# Patient Record
Sex: Female | Born: 1980 | Race: White | Hispanic: No | Marital: Single | Smoking: Current every day smoker
Health system: Southern US, Community
[De-identification: ages and names within clinical notes are randomized; demographics above are authoritative.]

---
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
## 1998-08-10 ENCOUNTER — Emergency Department (HOSPITAL_COMMUNITY): Admission: EM | Admit: 1998-08-10 | Discharge: 1998-08-10 | Payer: Self-pay | Admitting: Emergency Medicine

## 2001-12-30 ENCOUNTER — Ambulatory Visit (HOSPITAL_COMMUNITY): Admission: RE | Admit: 2001-12-30 | Discharge: 2001-12-30 | Payer: Self-pay | Admitting: *Deleted

## 2003-04-07 ENCOUNTER — Other Ambulatory Visit: Admission: RE | Admit: 2003-04-07 | Discharge: 2003-04-07 | Payer: Self-pay | Admitting: Obstetrics & Gynecology

## 2003-04-07 ENCOUNTER — Inpatient Hospital Stay (HOSPITAL_COMMUNITY): Admission: AD | Admit: 2003-04-07 | Discharge: 2003-04-07 | Payer: Self-pay | Admitting: Obstetrics & Gynecology

## 2003-04-07 ENCOUNTER — Encounter: Payer: Self-pay | Admitting: Obstetrics & Gynecology

## 2003-04-07 ENCOUNTER — Encounter (INDEPENDENT_AMBULATORY_CARE_PROVIDER_SITE_OTHER): Payer: Self-pay | Admitting: Specialist

## 2003-04-28 ENCOUNTER — Encounter: Admission: RE | Admit: 2003-04-28 | Discharge: 2003-04-28 | Payer: Self-pay | Admitting: Family Medicine

## 2003-04-28 ENCOUNTER — Inpatient Hospital Stay (HOSPITAL_COMMUNITY): Admission: AD | Admit: 2003-04-28 | Discharge: 2003-04-30 | Payer: Self-pay | Admitting: Family Medicine

## 2003-04-29 ENCOUNTER — Encounter (INDEPENDENT_AMBULATORY_CARE_PROVIDER_SITE_OTHER): Payer: Self-pay

## 2003-12-10 ENCOUNTER — Encounter (INDEPENDENT_AMBULATORY_CARE_PROVIDER_SITE_OTHER): Payer: Self-pay | Admitting: Specialist

## 2003-12-10 ENCOUNTER — Ambulatory Visit (HOSPITAL_COMMUNITY): Admission: AD | Admit: 2003-12-10 | Discharge: 2003-12-10 | Payer: Self-pay | Admitting: Obstetrics and Gynecology

## 2004-01-02 ENCOUNTER — Encounter: Admission: RE | Admit: 2004-01-02 | Discharge: 2004-01-02 | Payer: Self-pay | Admitting: Obstetrics & Gynecology

## 2004-05-23 ENCOUNTER — Emergency Department (HOSPITAL_COMMUNITY): Admission: EM | Admit: 2004-05-23 | Discharge: 2004-05-23 | Payer: Self-pay | Admitting: Emergency Medicine

## 2004-10-07 ENCOUNTER — Inpatient Hospital Stay (HOSPITAL_COMMUNITY): Admission: AD | Admit: 2004-10-07 | Discharge: 2004-10-09 | Payer: Self-pay | Admitting: Family Medicine

## 2004-10-07 ENCOUNTER — Encounter (INDEPENDENT_AMBULATORY_CARE_PROVIDER_SITE_OTHER): Payer: Self-pay | Admitting: Specialist

## 2004-10-08 ENCOUNTER — Ambulatory Visit: Payer: Self-pay | Admitting: Obstetrics & Gynecology

## 2005-10-05 IMAGING — US US OB LIMITED
1 series · 14 of 20 positions shown · non-contrast
Comparison: none

CLINICAL DATA: 38+ weeks gestation, for presentation.  No prenatal care.
 OBSTETRICAL ULTRASOUND LIMITED:
 A single fetus is present in cephalic position.  Fetal movement is detected.  The placenta is posteriorly located to the right and grade III.  No previa is seen.  The quantity of amniotic fluid is normal.  AFI is 16.4 cm.  For a 38-week gestation, 7.3 is in the 5th percentile and 23.9 in the 95th percentile.  
 Evaluation of fetal anatomy shows the lateral ventricles, thalami, stomach on left, stomach on left, both kidneys, and bladder to appear normal.  Much of the remainder of the study is limited due to advanced gestational age and request for presentation only.

[Series 1: us ob limited · 0.33mm/px · 14 of 20 slices shown]
[im 1/20]
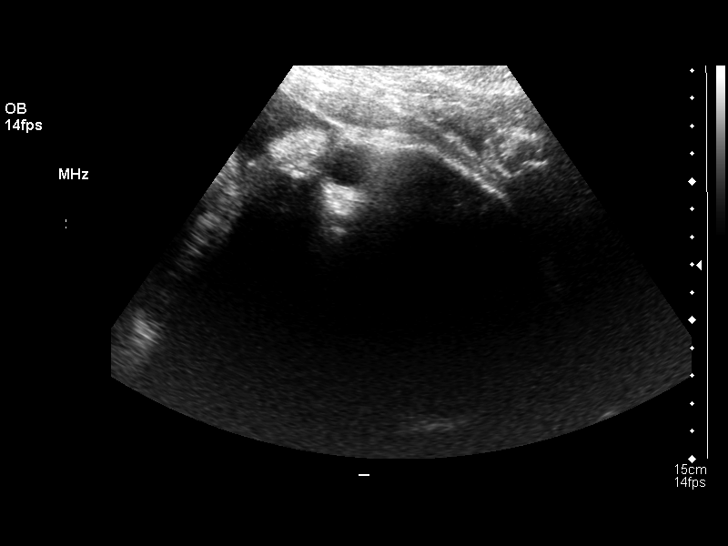
[im 3/20]
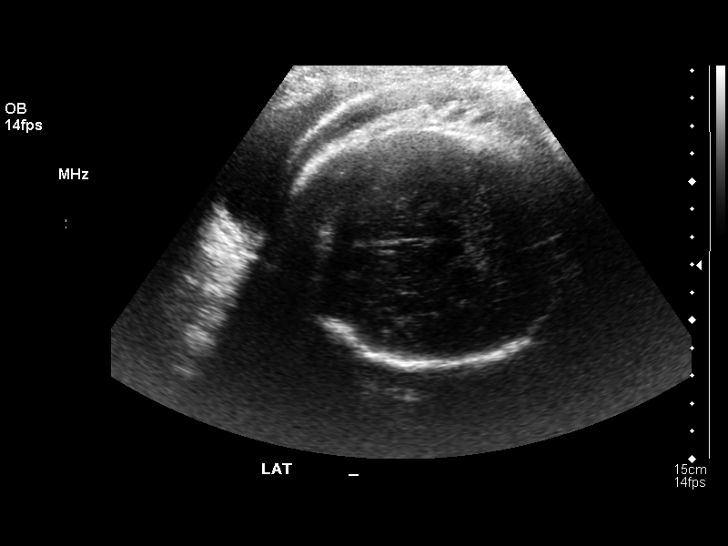
[im 4/20]
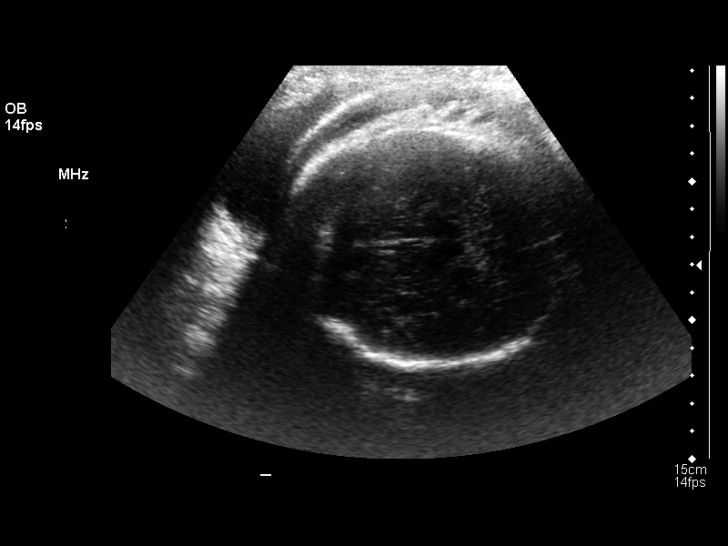
[im 6/20]
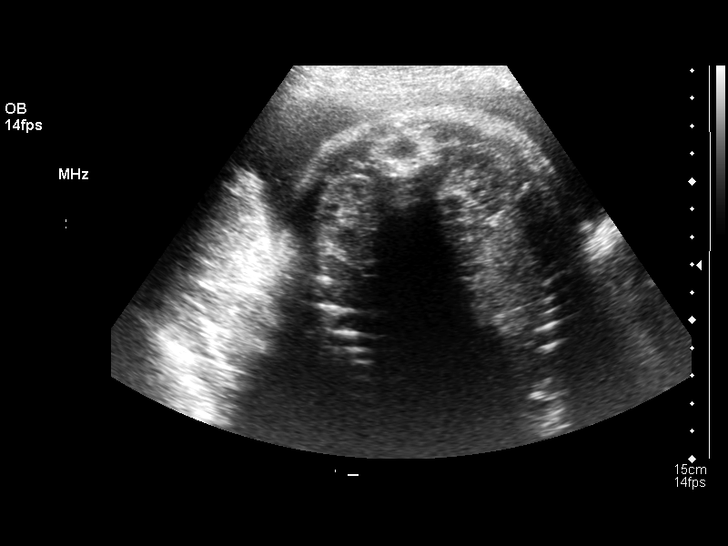
[im 7/20]
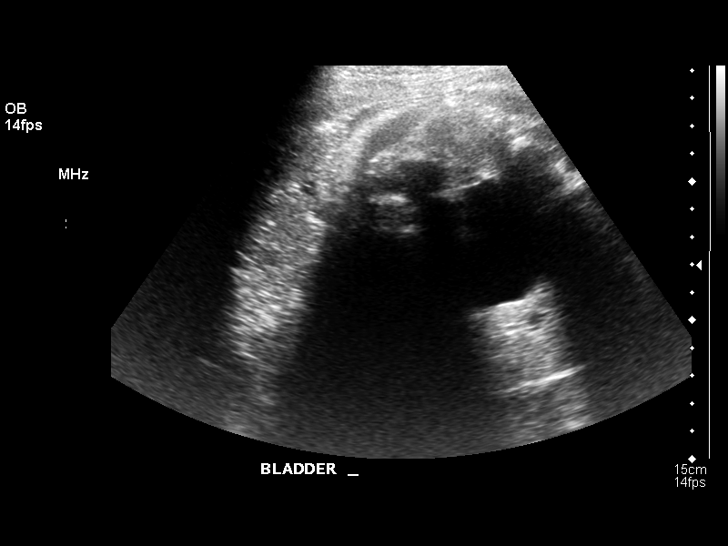
[im 8/20]
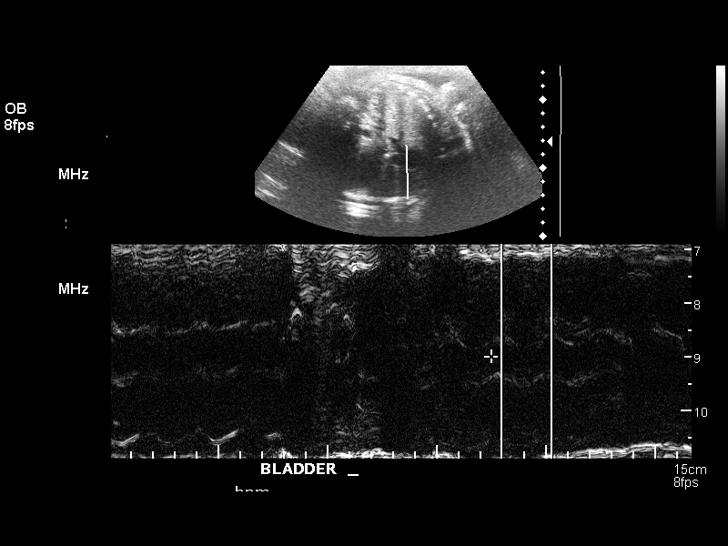
[im 10/20]
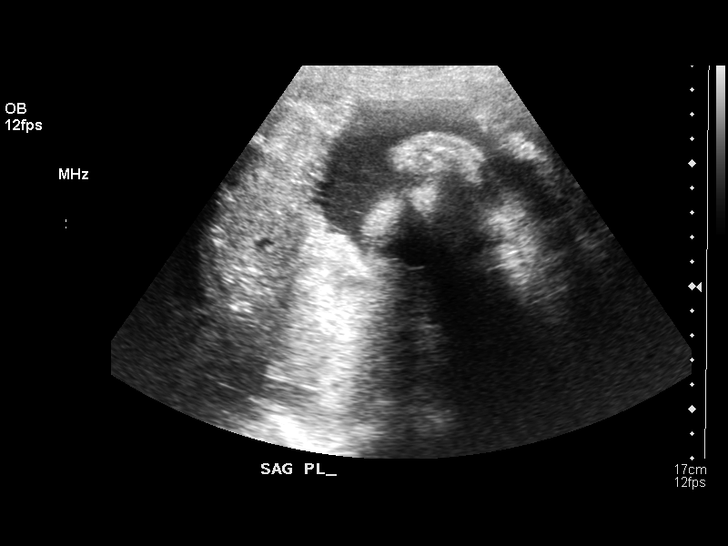
[im 11/20]
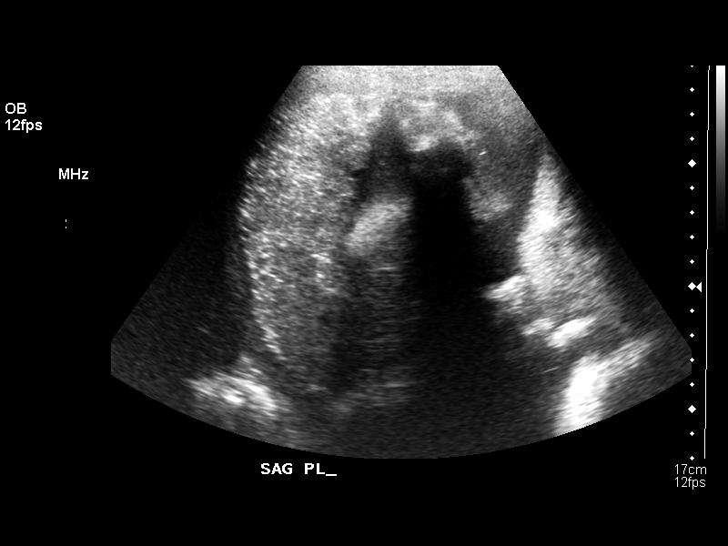
[im 13/20]
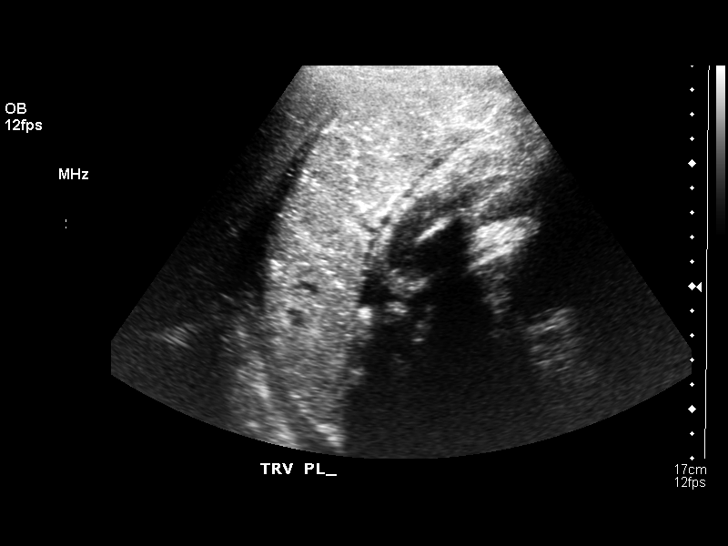
[im 14/20]
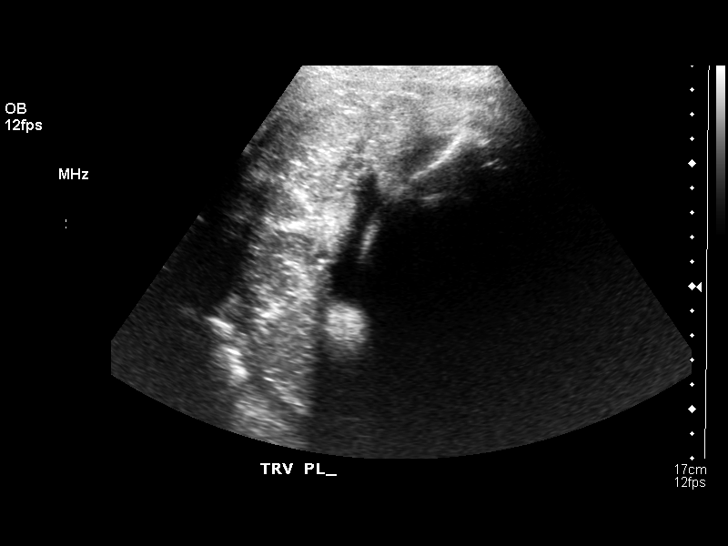
[im 16/20]
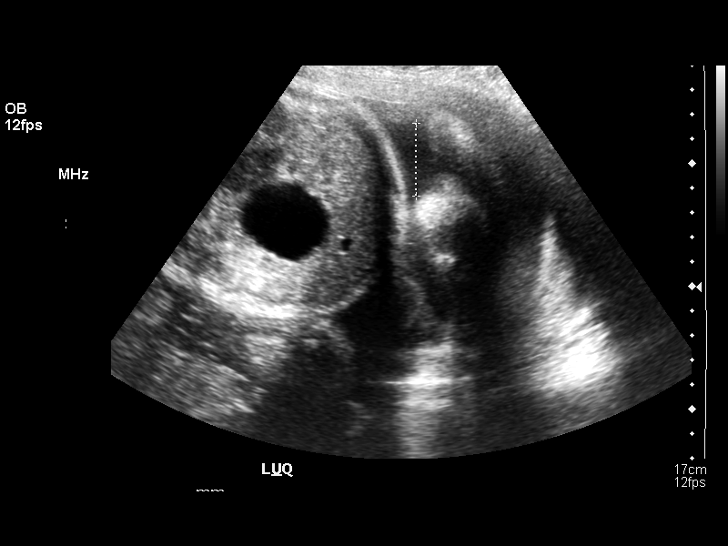
[im 17/20]
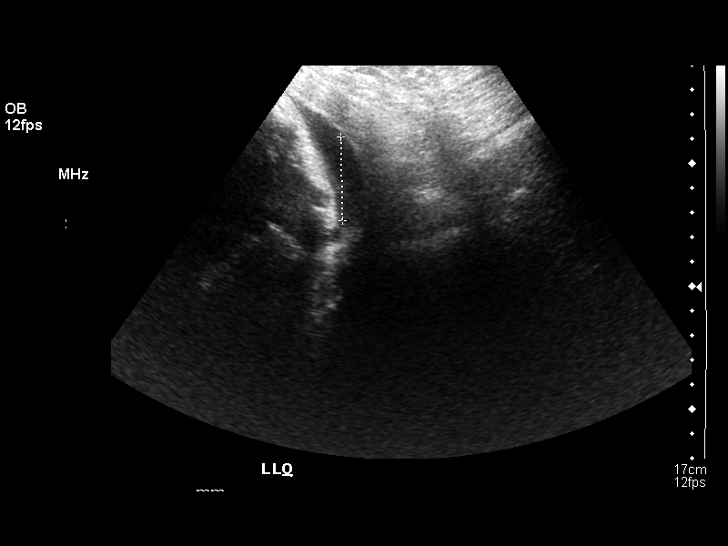
[im 18/20]
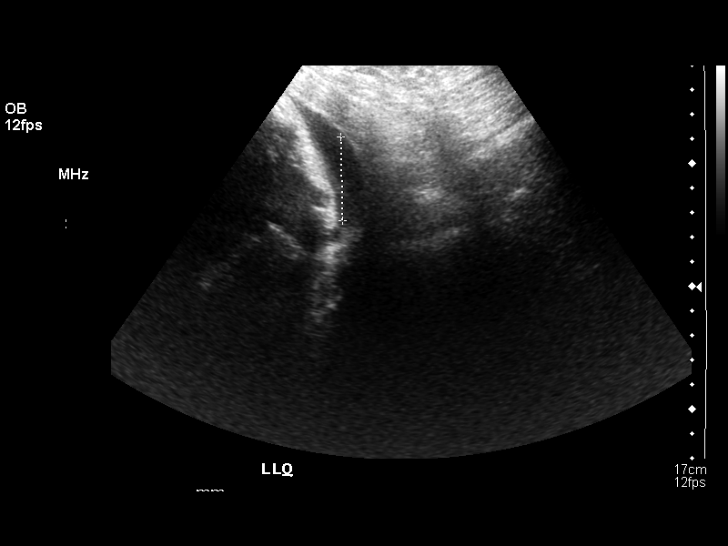
[im 20/20]
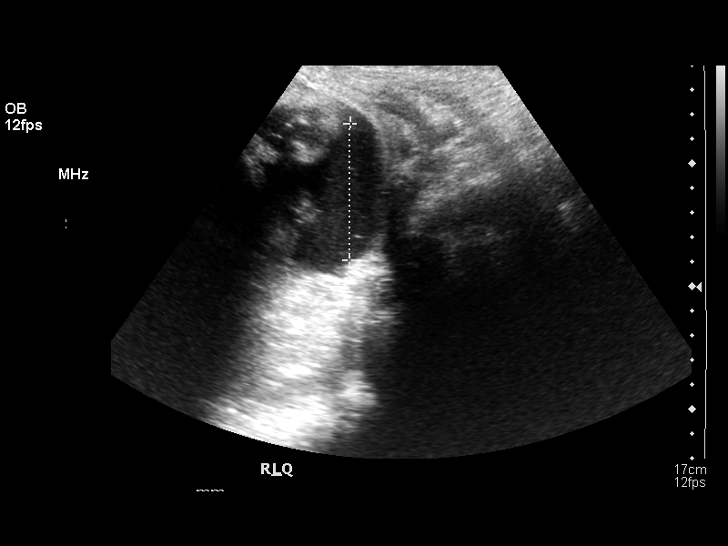

[14 of 20 positions shown; findings below may reference images not displayed]

IMPRESSION: Single fetus in cephalic position.  Grade III placenta.  Amniotic fluid within normal limits.

## 2007-10-05 ENCOUNTER — Observation Stay (HOSPITAL_COMMUNITY): Admission: EM | Admit: 2007-10-05 | Discharge: 2007-10-06 | Payer: Self-pay | Admitting: Emergency Medicine

## 2011-01-11 NOTE — Discharge Summary (Signed)
NAMEREMONIA, OTTE                  ACCOUNT NO.:  [id number]   MEDICAL RECORD NO.:  [id number]          PATIENT TYPE:  INP   LOCATION:  9123                          [facility]ACILITY:  [facility]   PHYSICIAN:  Conni Elliot, M.D.DATE OF BIRTH:  [birth year]   DATE OF ADMISSION:  10/07/2004  DATE OF DISCHARGE:  10/09/2004                                 DISCHARGE SUMMARY   DISCHARGE DIAGNOSES:  1.  Spontaneous vaginal delivery at term, approximately 38 weeks.  2.  No prenatal care.  3.  Cocaine abuse.  4.  Left tubal ligation.  The patient is status post right salpingectomy      prior to admission.  5.  Hypertension.   DISCHARGE MEDICATIONS:  1.  Ibuprofen 600 mg q.6h. p.r.n., #25 dispensed.  2.  HCTZ 25 mg p.o. daily, #45 dispensed.   DISPOSITION:  The patient was discharged to home likely without the infant.  At the time of dictation, the patient's meeting with child protective  services  was pending later in the morning to determine if the child would  go home with the patient or not.  The patient was positive for cocaine on  admission as well as the infant.   PROCEDURE:  Left tubal ligation.   CONSULTATIONS:  Social work.   HISTORY OF PRESENT ILLNESS:  The patient is a 30 year old G8, P7-0-1-7 who  presented at 60 and 6 weeks with contractions and in active labor.  The  patient's cervix on admission was 7 cm, 95% effaced, at a -1 station, with a  bulging bag of water.  The patient was admitted for expectant management.   HOSPITAL COURSE:  Problem 1.  Spontaneous vaginal delivery.  Postpartum  course was unremarkable.  The patient was discharged on postpartum day #2  and postoperative day #1 status post the left tubal ligation.  The patient  was ambulating, tolerating p.o. well, minimal pain, and mild lochia.  The  infant delivered on October 07, 2004 at 3016 by spontaneous vaginal  delivery.  Apgars were 9 at one minute and 9 at five minutes.  Epidural  anesthesia.  No lacerations.   Estimated blood loss less than 500 cc.  Three-  vessel cord.  Intact placenta spontaneously delivered at 1858 and was sent  to pathology.  The infant was a female infant weighing 5 pounds 12 ounces with  a length of 19.25 inches.  The patient had prenatal labs drawn after  admission which showed blood type A positive, antibody negative, RPR  nonreactive, rubella immune.  Female infant bottle feeding.  Disposition of  baby uncertain at the time of dictation.   Problem 2.  Postpartum hemoglobin 12.5.  No need for iron therapy.   Problem 3.  Substance abuse.  The patient's UDS was positive for cocaine on  admission.  On postpartum day #1, the patient left the hospital and returned  appearing intoxicated per nursing.  Repeat UDS was done on the 13th which  was also positive for cocaine and benzodiazepines.  No benzodiazepines were  given that day by the hospital.  The patient  was stable prior to discharge.   FOLLOW UP:  She was told to follow up at [facility] within six weeks.  She was given the phone number to make an appointment.      AD/MEDQ  D:  10/09/2004  T:  10/09/2004  Job:  [id number]   cc:   [facility]  [employer]

## 2011-01-11 NOTE — Op Note (Signed)
NAME:  Jill Church, Jill Church                  ACCOUNT NO.:  [id number]   MEDICAL RECORD NO.:  [id number]          PATIENT TYPE:  INP   LOCATION:  9123                          [facility]ACILITY:  [facility]   PHYSICIAN:  Lesly Dukes, M.D. DATE OF BIRTH:  [birth year]   DATE OF PROCEDURE:  10/08/2004  DATE OF DISCHARGE:                                 OPERATIVE REPORT   PREOPERATIVE DIAGNOSIS:  1.  Multiparity.  2.  Desires permanent sterilization.   POSTOPERATIVE DIAGNOSIS:  1.  Multiparity.  2.  Desires permanent sterilization.   PROCEDURE:  Left tubal ligation, Filshie clip.  Right tube was surgically  removed from a prior salpingectomy for ectopic pregnancy.   SURGEON:  Lesly Dukes, M.D.   ASSISTANT:  Jon Gills, M.D.   COMPLICATIONS:  None.   ANESTHESIA:  General.   ESTIMATED BLOOD LOSS:  Less than 20 cc.   PATHOLOGY:  None.   INDICATIONS:  A 30 year old G8 P7-0-1-8, status post vaginal delivery, who  desires permanent sterilization.  Risks and benefits of the procedure were  discussed with the patient, including increased risk of ectopic gestation  and a risk failure of less than 1%.   FINDINGS:  Normal uterus, left tube, and ovary, with surgically absent right  tube.   PROCEDURE:  The patient was taken to the operating room, where general  anesthesia was found to be adequate.  A small vertical infraumbilical skin  incision was made with a scalpel.  The incision was carried down through the  underlying fascia until the peritoneum was identified and entered.  The  peritoneum was noted to be free of any adhesions, and the incision was then  extended with Metzenbaum scissors.   The patient's left fallopian tube was identified, brought to the incision,  and grasped with a Babcock clamp.  The tube was followed to the fimbria.  The Babcock clamp was then used to grasp the tube approximately 3-4 cm from  the cornua.  A Filshie clip was applied.  Good hemostasis was noted, and the  tube was returned to the abdomen.  The right fallopian tube was noted to be  surgically absent.   The fascia was closed in a single layer using 3-0 Vicryl.  The skin was  closed in a subcuticular fashion using 3-0 Vicryl.   The patient tolerated the procedure well.  Sponge, lap, and needle counts  were correct x 2.  The patient was taken to the recovery room in stable  condition.      LC/MEDQ  D:  10/09/2004  T:  10/09/2004  Job:  [id number]

## 2011-01-11 NOTE — Discharge Summary (Signed)
NAME:  Jill Church, Jill Church                            ACCOUNT NO.:  [id number]   MEDICAL RECORD NO.:  [id number]                   PATIENT TYPE:  INP   LOCATION:  9306                                 [facility]   PHYSICIAN:  Nilda Simmer, M.D.                  DATE OF BIRTH:  1981-01-12   DATE OF ADMISSION:  04/28/2003  DATE OF DISCHARGE:  04/30/2003                                 DISCHARGE SUMMARY   PROCEDURE:  None.   DISCHARGE DIAGNOSES:  1. Intrauterine pregnancy at 37-2/[redacted] weeks gestation.  2. Vaginal bleeding, consistent with abruptio placentae.  3. Vaginal delivery of a viable female infant.  4. Cocaine abuse.  5. Marijuana abuse.  6. Tobacco abuse.  7. Noncompliance/limited prenatal care.   DISCHARGE MEDICATIONS:  1. Ibuprofen 600 mg 1 p.o. q. 6 hours p.r.n. pain.  2. Prenatal vitamins 1 tablet p.o. q.d. for the next 6 weeks.   FOLLOW UP:  1. The patient is recommended to followup at [facility]     in 6 weeks for a postpartum evaluation.  2. The patient will followup at [facility] for     evaluation of IUD placement at her postpartum visit.   HOSPITAL COURSE:  Ms. Cohoon is a 30 year old G6, P5-0-0-5, presenting at 59-  2/[redacted] weeks gestation secondary to vaginal bleeding. Upon evaluation, the  patient admitted to cocaine use approximately 30 minutes prior to onset of  bright red blood per vagina. The patient admitted to daily crack cocaine use  as well as marijuana use. The patient had established initial visit at high  risk clinic on day of presentation. The patient was admitted for expectant  management and delivery of fetus. Delivery of a viable female infant with  Apgars of 9 at 1 and 9 at 5. Placenta membranes were delivered intact and  was present of a large amount of clot that was adherent to the edge of the  placenta. The placenta was sent for pathology. The patient did well  postpartum. However, did decline bilateral tubal  ligation that was  previously scheduled for her. Social work did meet with patient and address  and cocaine and marijuana use in August as well as on the day of  presentation. CPS has been involved with patient and her children in the  past and patient was informed that infant would not be discharged with her.  The patient was requesting discharge on postpartum day 1 and she was in  stable condition.   LABORATORY DATA:  White blood cell is 13.0, hemoglobin 11.8, platelets  182,000. Blood type is A+. Rubella immune. Fibrinogen of 418, INR 0.9,  ProTime of 12.3, platelets of 180,000. No schistocytes were appreciated on  smear. RPR was non-reactive. Hepatitis surface antigen negative. HIV  negative. Gonorrhea negative. Chlamydia negative. GBS negative.   DISCHARGE INSTRUCTIONS:  Wound care not applicable.  Diet regular. Activity,  nothing per vagina for the next 6 weeks. Otherwise, normal activity.   FOLLOW UP:  The patient was recommended to present to Maternity Admissions  for the development of fever, severe abdominal pain, worsening bleeding.                                               Nilda Simmer, M.D.    KS/MEDQ  D:  04/30/2003  T:  04/30/2003  Job:  [id number]

## 2011-01-11 NOTE — Op Note (Signed)
NAME:  Jill Church, Doshie L                            ACCOUNT NO.:  [id number]   MEDICAL RECORD NO.:  [id number]                   PATIENT TYPE:  AMB   LOCATION:  SDC                                  [facility]  [facility]   PHYSICIAN:  Tracie Harrier, M.D.              DATE OF BIRTH:  Mar 27, 1981   DATE OF PROCEDURE:  12/10/2003  DATE OF DISCHARGE:  12/10/2003                                 OPERATIVE REPORT   PREOPERATIVE DIAGNOSIS:  Probable ruptured tubal pregnancy - right.   POSTOPERATIVE DIAGNOSES:  1. Ruptured right tubal pregnancy.  2. Endometriosis, left ovary.   PROCEDURE:  1. Laparoscopy.  2. Right salpingectomy.  3. Ablation of endometriosis.   SURGEON:  Dr. Alan Mulder.   ANESTHESIA:  General.   ESTIMATED BLOOD LOSS:  300 cc.   COMPLICATIONS:  None.   FINDINGS:  At time of laparoscopy, a moderate hemoperitoneum was encountered  of about 300 cc.  The right tube was dilated in its middle two-thirds and  bleeding.  The right ovary was visualized and noted to be normal.  The left  ovary had endometriosis on it.  The left tube was normal.  The uterus was  also normal.   The right tube was removed.  The small areas of endometriosis were ablated  on the left ovary using the bipolar cautery.   PROCEDURE:  The patient was taken to the operating room where a general  endotracheal anesthetic was administered.  The patient was placed on the  operating room table in the dorsal lithotomy position, the abdomen,  peroneum, and vagina was prepped and draped in the usual sterile fashion  with Betadine and sterile drapes.  A Foley catheter was inserted.  A  perioperative antibiotic was administered.  The infraumbilical area was  infiltrated with 8 cc of 0.25% Marcaine and the suprapubic was infiltrated  with 2 cc of 0.25% Marcaine.  Two small incisions were made, one vertical  incision about 2 cm in size was made under the umbilicus.  A suprapubic  incision was made about 2 cm in the  suprapubic area about three  fingerbreadths above the pubic symphysis.  Next, the Veress needle was  inserted through the umbilical site in an atraumatic fashion.  A  pneumoperitoneum was then created with carbon dioxide gas, creating a  pressure of 15 mmHg.  The Veress needle was then removed and a disposable  laparoscopic trocar was inserted atraumatically into the abdominal cavity.  A laparoscope was then inserted with the above-noted findings.  A ruptured  right tubal pregnancy was encountered.  The middle two-thirds of the  fallopian tube was destroyed, dilated and bleeding.  Therefore, the right  tube was removed.  The tube was grasped with an atraumatic grasper.  This  was done through a 5 mm trocar sheath, this trocar was placed under direct,  continuous laparoscopic guidance and eventually replaced with  a 10 mm  trocar.  The tube was grasped with a grasper and elevated into the anterior  portion of the abdominal wall.  The mesosalpinx was then cauterized using  the Gyrus bipolar cutting device.  The mesosalpinx was thoroughly cauterized  and the tube was dissected off the underlying tissue from about 1 cm from  the uterine fundus and removing the entire tube.  The base of the  mesosalpinx was cauterized thoroughly and noted to be hemostatic.  A 10-  11trocar was then placed in the suprapubic area and an endo-catch bag was  placed and the tube removed easily through the suprapubic site.  The pelvis  was then thoroughly irrigated with copious amounts of irrigant and all clot  was attempted to be removed.  However, all clot was not completely removed.  The pneumoperitoneum was removed as well as much as possible.  The pelvis  was thoroughly irrigated as mentioned.  There were some areas of shaggy  endometrium on the left ovary and this was cauterized using the same Gyrus  open-forceps device.  This was cauterized and irrigated.  The ovary was  normalized.  The pelvis was otherwise  normal.  Attention was then turned to  closure.   All abdominal instruments were removed and the gas was allowed to completely  escape from the abdomen.  The infraumbilical incision was closed with three  interrupted sutures of 0 Vicryl on the muscle fascia and the suprapubic site  was closed with two interrupted sutures of 0 Vicryl in the muscle fascia.  The skin reapproximated with multiple interrupted sutures of 4-0 Vicryl  Rapide.  The patient's blood type is A positive.  She was awakened,  extubated and taken to the recovery room in good condition.  All vaginal  instruments were removed.  There were no perioperative complications.  Blood  loss estimated at 300 cc including the pneumoperitoneum.                                               Tracie Harrier, M.D.    REG/MEDQ  D:  12/11/2003  T:  12/12/2003  Job:  [id number]

## 2015-07-20 ENCOUNTER — Encounter (HOSPITAL_COMMUNITY): Payer: Self-pay | Admitting: Cardiology

## 2015-07-20 ENCOUNTER — Emergency Department (HOSPITAL_COMMUNITY)
Admission: EM | Admit: 2015-07-20 | Discharge: 2015-07-20 | Disposition: A | Discharge status: Home / Self Care | Payer: Self-pay | Attending: Emergency Medicine | Admitting: Emergency Medicine

## 2015-07-20 DIAGNOSIS — F17200 Nicotine dependence, unspecified, uncomplicated: Secondary | ICD-10-CM | POA: Insufficient documentation

## 2015-07-20 DIAGNOSIS — K651 Peritoneal abscess: Secondary | ICD-10-CM

## 2015-07-20 DIAGNOSIS — J36 Peritonsillar abscess: Secondary | ICD-10-CM | POA: Insufficient documentation

## 2015-07-20 LAB — CBC WITH DIFFERENTIAL/PLATELET
BASOS PCT: 0 %
Basophils Absolute: 0 10*3/uL (ref 0.0–0.1)
EOS ABS: 0 10*3/uL (ref 0.0–0.7)
EOS PCT: 0 %
HCT: 48.8 % — ABNORMAL HIGH (ref 36.0–46.0)
Hemoglobin: 16.2 g/dL — ABNORMAL HIGH (ref 12.0–15.0)
LYMPHS ABS: 2.8 10*3/uL (ref 0.7–4.0)
Lymphocytes Relative: 16 %
MCH: 29.2 pg (ref 26.0–34.0)
MCHC: 33.2 g/dL (ref 30.0–36.0)
MCV: 87.9 fL (ref 78.0–100.0)
MONO ABS: 1.2 10*3/uL — AB (ref 0.1–1.0)
Monocytes Relative: 7 %
NEUTROS PCT: 77 %
Neutro Abs: 13.7 10*3/uL — ABNORMAL HIGH (ref 1.7–7.7)
PLATELETS: 259 10*3/uL (ref 150–400)
RBC: 5.55 MIL/uL — ABNORMAL HIGH (ref 3.87–5.11)
RDW: 14.4 % (ref 11.5–15.5)
WBC: 17.7 10*3/uL — ABNORMAL HIGH (ref 4.0–10.5)

## 2015-07-20 LAB — I-STAT CHEM 8, ED
BUN: 11 mg/dL (ref 6–20)
CALCIUM ION: 1.17 mmol/L (ref 1.12–1.23)
CHLORIDE: 103 mmol/L (ref 101–111)
Creatinine, Ser: 0.7 mg/dL (ref 0.44–1.00)
GLUCOSE: 110 mg/dL — AB (ref 65–99)
HCT: 55 % — ABNORMAL HIGH (ref 36.0–46.0)
HEMOGLOBIN: 18.7 g/dL — AB (ref 12.0–15.0)
Potassium: 4.1 mmol/L (ref 3.5–5.1)
SODIUM: 141 mmol/L (ref 135–145)
TCO2: 25 mmol/L (ref 0–100)

## 2015-07-20 LAB — RAPID STREP SCREEN (MED CTR MEBANE ONLY): STREPTOCOCCUS, GROUP A SCREEN (DIRECT): NEGATIVE

## 2015-07-20 MED ORDER — HYDROCODONE-ACETAMINOPHEN 5-325 MG PO TABS: 2.0000 | ORAL_TABLET | Freq: Four times a day (QID) | ORAL | Status: AC | PRN

## 2015-07-20 MED ORDER — MORPHINE SULFATE (PF) 4 MG/ML IV SOLN
4.0000 mg | Freq: Once | INTRAVENOUS | Status: AC
Start: 2015-07-20 — End: 2015-07-20
  Administered 2015-07-20: 4 mg via INTRAVENOUS
  Filled 2015-07-20: qty 1

## 2015-07-20 MED ORDER — BENZOCAINE 20 % MT SOLN
Freq: Once | OROMUCOSAL | Status: DC
  Filled 2015-07-20: qty 57

## 2015-07-20 MED ORDER — LIDOCAINE-EPINEPHRINE (PF) 2 %-1:200000 IJ SOLN
20.0000 mL | Freq: Once | INTRAMUSCULAR | Status: AC
  Administered 2015-07-20: 20 mL via INTRADERMAL
  Filled 2015-07-20: qty 20

## 2015-07-20 MED ORDER — SODIUM CHLORIDE 0.9 % IV SOLN
3.0000 g | Freq: Once | INTRAVENOUS | Status: DC
  Filled 2015-07-20: qty 3

## 2015-07-20 MED ORDER — CLINDAMYCIN HCL 150 MG PO CAPS: 150.0000 mg | ORAL_CAPSULE | Freq: Four times a day (QID) | ORAL | Status: AC

## 2015-07-20 MED ORDER — CLINDAMYCIN PHOSPHATE 900 MG/50ML IV SOLN
900.0000 mg | Freq: Once | INTRAVENOUS | Status: AC
  Administered 2015-07-20: 900 mg via INTRAVENOUS
  Filled 2015-07-20: qty 50

## 2015-07-20 MED ORDER — DEXAMETHASONE SODIUM PHOSPHATE 10 MG/ML IJ SOLN
20.0000 mg | Freq: Once | INTRAMUSCULAR | Status: AC
  Administered 2015-07-20: 20 mg via INTRAVENOUS
  Filled 2015-07-20: qty 2

## 2015-07-20 NOTE — Consult Note (Signed)
Reason for Consult: Left peritonsillar abscess  HPI:  Jill Church is an 34 y.o. female who presents to the ER today c/o left side sore throat for 1 week. It has significantly worsened over the past 2 days. Pt reports having difficulty tolerating secretions. She has been taking Theraflu without relief. Pt has hx of recurrent strep throat and states her symptoms feel similar. Denies rhinnorhea, sneezing, cough, fever, chills, nausea, vomiting, abdominal pain, or any other associated symptoms. No recent sick contact   History reviewed. No pertinent past medical history.  History reviewed. No pertinent past surgical history.  History reviewed. No pertinent family history.  Social History:  reports that she has been smoking.  She does not have any smokeless tobacco history on file. She reports that she does not drink alcohol or use illicit drugs.  Allergies: No Known Allergies  Prior to Admission medications   Not on File    Results for orders placed or performed during the hospital encounter of 07/20/15 (from the past 48 hour(s))  Rapid strep screen (not at [facility])     Status: None   Collection Time: 07/20/15 11:33 AM  Result Value Ref Range   Streptococcus, Group A Screen (Direct) NEGATIVE NEGATIVE    Comment: (NOTE) A Rapid Antigen test may result negative if the antigen level in the sample is below the detection level of this test. The FDA has not cleared this test as a stand-alone test therefore the rapid antigen negative result has reflexed to a Group A Strep culture.   CBC with Differential/Platelet     Status: Abnormal (Preliminary result)   Collection Time: 07/20/15 11:51 AM  Result Value Ref Range   WBC 17.7 (H) 4.0 - 10.5 K/uL   RBC 5.55 (H) 3.87 - 5.11 MIL/uL   Hemoglobin 16.2 (H) 12.0 - 15.0 g/dL   HCT 16.1 (H) 09.6 - 04.5 %   MCV 87.9 78.0 - 100.0 fL   MCH 29.2 26.0 - 34.0 pg   MCHC 33.2 30.0 - 36.0 g/dL   RDW 40.9 81.1 - 91.4 %   Platelets 259 150 - 400 K/uL   Neutrophils Relative % PENDING %   Neutro Abs PENDING 1.7 - 7.7 K/uL   Band Neutrophils PENDING %   Lymphocytes Relative PENDING %   Lymphs Abs PENDING 0.7 - 4.0 K/uL   Monocytes Relative PENDING %   Monocytes Absolute PENDING 0.1 - 1.0 K/uL   Eosinophils Relative PENDING %   Eosinophils Absolute PENDING 0.0 - 0.7 K/uL   Basophils Relative PENDING %   Basophils Absolute PENDING 0.0 - 0.1 K/uL   WBC Morphology PENDING    RBC Morphology PENDING    Smear Review PENDING    nRBC PENDING 0 /100 WBC   Metamyelocytes Relative PENDING %   Myelocytes PENDING %   Promyelocytes Absolute PENDING %   Blasts PENDING %  I-stat chem 8, ed     Status: Abnormal   Collection Time: 07/20/15 12:04 PM  Result Value Ref Range   Sodium 141 135 - 145 mmol/L   Potassium 4.1 3.5 - 5.1 mmol/L   Chloride 103 101 - 111 mmol/L   BUN 11 6 - 20 mg/dL   Creatinine, Ser 7.82 0.44 - 1.00 mg/dL   Glucose, Bld 956 (H) 65 - 99 mg/dL   Calcium, Ion 2.13 0.86 - 1.23 mmol/L   TCO2 25 0 - 100 mmol/L   Hemoglobin 18.7 (H) 12.0 - 15.0 g/dL   HCT 57.8 (H) 46.9 -  46.0 %    Review of Systems  Constitutional: Negative for fever and chills.  HENT: Positive for sore throat and trouble swallowing. Negative for rhinorrhea and sneezing.  Respiratory: Negative for cough.  Gastrointestinal: Negative for nausea, vomiting and abdominal pain.   Blood pressure 146/102, pulse 92, temperature 98.3 F (36.8 C), temperature source Oral, resp. rate 18, weight 64.467 kg (142 lb 2 oz), last menstrual period 07/12/2015, SpO2 97 %. Physical Exam  Constitutional: She is oriented to person, place, and time. She appears well-developed and well-nourished. No distress.  Head: Normocephalic and atraumatic.  Eyes: Conjunctivae and EOM are normal.  Ears: Normal auricles and EACs. Nose: Normal mucosa. Mouth: Left peritonsillar abscess with uvula deviation. Neck: Neck supple. No tracheal deviation present.  Cardiovascular: Normal rate, regular  rhythm and normal heart sounds.  Pulmonary/Chest: Effort normal and breath sounds normal. No respiratory distress. Musculoskeletal: Normal range of motion.  Neurological: She is alert and oriented to person, place, and time.  Skin: Skin is warm and dry.  Psychiatric: She has a normal mood and affect. Her behavior is normal.  Nursing note and vitals reviewed.  Procedure: Incision and drainage of the left peritonsillar abscess.   Anesthesia: local anesthesia with 1% lidocaine with epinephrine.   Description: The patient is placed upright on the hospital bed.  After adequate local anesthesia is achieved, an 20-G spinal needle is used to make multiple passes over the left superior tonsillar pole.  Approximately 3 cc of purulent fluid was evacuated.  The abscess cavity was incised opened with the spinal needle tip.  The patient tolerated the procedure well.    Assessment/Plan: Left peritonsillar abscess. I&D performed in the ER. IV clindamycin and decadron in the ER. Pt will be discharged home on clindamycin 300mg  po QID for 10days. She may f/u in my office in 1 week if still symptomatic.  Adama Ivins,SUI W 07/20/2015, 12:29 PM

## 2015-07-20 NOTE — ED Notes (Signed)
Pt reports a sore throat for the past 2 days. Thinks she may have strep.

## 2015-07-20 NOTE — ED Notes (Signed)
Pt states she has been told she has high bp

## 2015-07-20 NOTE — Discharge Instructions (Signed)
Please take antibiotic as prescribed for the full duration.  Take pain medication as needed.  Follow up with ENT specialist if no improvement.    Peritonsillar Abscess A peritonsillar abscess is a collection of yellowish-white fluid (pus) in the back of the throat behind the tonsils. It usually occurs when an infection of the throat or tonsils (tonsillitis) spreads into the tissues around the tonsils. CAUSES The infection that leads to a peritonsillar abscess is usually caused by streptococcal bacteria.  SIGNS AND SYMPTOMS  Sore throat, often with pain on just one side.  Swelling and tenderness of the glands (lymph nodes) in the neck.  Difficulty swallowing.  Difficulty opening your mouth.  Fever.  Chills.  Drooling because of difficulty swallowing saliva.  Headache.  Changes in your voice.  Bad breath. DIAGNOSIS Your health care provider will take your medical history and do a physical exam. Imaging tests may be done, such as an ultrasound or CT scan. A sample of pus may be removed from the abscess using a needle (needle aspiration) or by swabbing the back of your throat. This sample will be sent to a lab for testing. TREATMENT Treatment usually involves draining the pus from the abscess. This may be done through needle aspiration or by making an incision in the abscess. You will also likely need to take antibiotic medicine. HOME CARE INSTRUCTIONS  Rest as much as possible and get plenty of sleep.  Take medicines only as directed by your health care provider.  If you were prescribed an antibiotic medicine, finish it all even if you start to feel better.  If your abscess was drained by your health care provider, gargle with a mixture of salt and warm water:  Mix 1 tsp of salt in 8 oz of warm water.  Gargle with this mixture four times per day or as needed for comfort.  Do not swallow this mixture.  Drink plenty of fluids.  While your throat is sore, eat soft or liquid  foods, such as frozen ice pops and ice cream.  Keep all follow-up visits as directed by your health care provider. This is important. SEEK MEDICAL CARE IF:  You have increased pain, swelling, redness, or drainage in your throat.  You develop a headache, a lack of energy (lethargy), or generalized feelings of illness.  You have a fever.  You feel dizzy.  You have difficulty swallowing or eating.  You show signs of becoming dehydrated, such as:  Light-headedness when standing.  Decreased urine output.  A fast heart rate.  Dry mouth. SEEK IMMEDIATE MEDICAL CARE IF:   You have difficulty talking or breathing, or you find it easier to breathe when you lean forward.  You are coughing up blood or vomiting blood.  You have severe throat pain that is not helped by medicines.  You start to drool.   This information is not intended to replace advice given to you by your health care provider. Make sure you discuss any questions you have with your health care provider.   Document Released: 08/12/2005 Document Revised: 09/02/2014 Document Reviewed: 03/28/2014 Elsevier Interactive Patient Education [employer]2016 [employer]

## 2015-07-20 NOTE — ED Provider Notes (Signed)
CSN: [id number]     Arrival date & time 07/20/15  1105 History  By signing my name below, I, Tanda Rockers, attest that this documentation has been prepared under the direction and in the presence of Fayrene Helper, PA-C. Electronically Signed: Tanda Rockers, ED Scribe. 07/20/2015. 11:45 AM.  Chief Complaint  Patient presents with  . Sore Throat   The history is provided by the patient. No language interpreter was used.     HPI Comments: Jill Church is a 34 y.o. female who presents to the Emergency Department complaining of gradual onset, constant, sore throat for the past week but worsen in the past 2 days. Pt reports having difficulty tolerating secretions as well. She has been taking Theraflu without relief. Pt has hx of recurrent strep throat and states her symptoms feel similar. Denies rhinnorhea, sneezing, cough, fever, chills, nausea, vomiting, abdominal pain, or any other associated symptoms. No recent sick contact with similar symptoms. Pt is current everyday smoker.    History reviewed. No pertinent past medical history. History reviewed. No pertinent past surgical history. History reviewed. No pertinent family history. Social History  Substance Use Topics  . Smoking status: Current Every Day Smoker  . Smokeless tobacco: None  . Alcohol Use: No   OB History    No data available     Review of Systems  Constitutional: Negative for fever and chills.  HENT: Positive for sore throat and trouble swallowing. Negative for rhinorrhea and sneezing.   Respiratory: Negative for cough.   Gastrointestinal: Negative for nausea, vomiting and abdominal pain.      Allergies  Review of patient's allergies indicates no known allergies.  Home Medications   Prior to Admission medications   Not on File   Triage Vitals: BP 146/102 mmHg  Pulse 92  Temp(Src) 98.3 F (36.8 C) (Oral)  Resp 18  Wt 142 lb 2 oz (64.467 kg)  SpO2 97%  LMP 07/12/2015   Physical Exam  Constitutional:  She is oriented to person, place, and time. She appears well-developed and well-nourished. No distress.  HENT:  Head: Normocephalic and atraumatic.  Uvula mildly deviated Moderate edema noted to the left tonsillar pillar with enlarged tonslls without exudates  Eyes: Conjunctivae and EOM are normal.  Neck: Neck supple. No tracheal deviation present.  Cervical lymphadenopathy present Trachea is midline  Cardiovascular: Normal rate, regular rhythm and normal heart sounds.   Pulmonary/Chest: Effort normal and breath sounds normal. No respiratory distress. She has no wheezes. She has no rhonchi. She has no rales.  Abdominal:  No splengomegaly  Musculoskeletal: Normal range of motion.  Neurological: She is alert and oriented to person, place, and time.  Skin: Skin is warm and dry.  Psychiatric: She has a normal mood and affect. Her behavior is normal.  Nursing note and vitals reviewed.   ED Course  Angiocath insertion Date/Time: 07/20/2015 12:23 PM Performed by: Fayrene Helper Authorized by: Fayrene Helper Consent: Verbal consent obtained. Risks and benefits: risks, benefits and alternatives were discussed Consent given by: patient Patient understanding: patient states understanding of the procedure being performed Patient consent: the patient's understanding of the procedure matches consent given Patient identity confirmed: verbally with patient and arm band Local anesthesia used: no Patient sedated: no Patient tolerance: Patient tolerated the procedure well with no immediate complications Comments: A 20 gauge IV inserted to L forearm with good blood return and flushed without difficulty.   (including critical care time)  DIAGNOSTIC STUDIES: Oxygen Saturation is 97% on RA, normal  by my interpretation.    COORDINATION OF CARE: 11:43 AM-Discussed treatment plan which includes Rx antibiotics with pt at bedside and pt agreed to plan.   12:18 PM Evidence of L peritonsillar abscess.  No  airway compromise.  I have consulted ENT Dr. Suszanne Connerseoh who agrees to see pt in the ER.  Will start Unasyn, obtain labs and provide pain medication.  IV access obtained by me.  12:25 PM Dr. Suszanne Connerseoh performed bedside I&D of abscess with success.  Pt will be discharge with pain medication Dr. Suszanne Connerseoh recommend switching to Clindamycin 900mg  IV with decadron 20mg  IV while in ER.  Pt to be discharge with clindamycin 300mg  QID x 10 days, along with pain medication and outpt f/u.  Return precaution discussed.    Labs Review Labs Reviewed  CBC WITH DIFFERENTIAL/PLATELET - Abnormal; Notable for the following:    WBC 17.7 (*)    RBC 5.55 (*)    Hemoglobin 16.2 (*)    HCT 48.8 (*)    Neutro Abs 13.7 (*)    Monocytes Absolute 1.2 (*)    All other components within normal limits  I-STAT CHEM 8, ED - Abnormal; Notable for the following:    Glucose, Bld 110 (*)    Hemoglobin 18.7 (*)    HCT 55.0 (*)    All other components within normal limits  RAPID STREP SCREEN (NOT [facility])  CULTURE, GROUP A STREP    Imaging Review No results found.    EKG Interpretation None      MDM   Final diagnoses:  Peritoneal abscess (HCC)   BP 146/102 mmHg  Pulse 92  Temp(Src) 98.3 F (36.8 C) (Oral)  Resp 18  Wt 64.467 kg  SpO2 97%  LMP 07/12/2015   I personally performed the services described in this documentation, which was scribed in my presence. The recorded information has been reviewed and is accurate.        Fayrene HelperBowie Kareli Hossain, PA-C 07/20/15 1320  Rolan BuccoMelanie Belfi, MD 07/20/15 1340

## 2015-07-23 LAB — CULTURE, GROUP A STREP

## 2022-07-26 DIAGNOSIS — Z419 Encounter for procedure for purposes other than remedying health state, unspecified: Secondary | ICD-10-CM | POA: Diagnosis not present

## 2022-08-26 DIAGNOSIS — Z419 Encounter for procedure for purposes other than remedying health state, unspecified: Secondary | ICD-10-CM | POA: Diagnosis not present

## 2022-09-26 DIAGNOSIS — Z419 Encounter for procedure for purposes other than remedying health state, unspecified: Secondary | ICD-10-CM | POA: Diagnosis not present

## 2022-10-25 DIAGNOSIS — Z419 Encounter for procedure for purposes other than remedying health state, unspecified: Secondary | ICD-10-CM | POA: Diagnosis not present

## 2022-11-25 DIAGNOSIS — Z419 Encounter for procedure for purposes other than remedying health state, unspecified: Secondary | ICD-10-CM | POA: Diagnosis not present

## 2022-12-25 DIAGNOSIS — Z419 Encounter for procedure for purposes other than remedying health state, unspecified: Secondary | ICD-10-CM | POA: Diagnosis not present

## 2023-01-25 DIAGNOSIS — Z419 Encounter for procedure for purposes other than remedying health state, unspecified: Secondary | ICD-10-CM | POA: Diagnosis not present

## 2023-02-24 DIAGNOSIS — Z419 Encounter for procedure for purposes other than remedying health state, unspecified: Secondary | ICD-10-CM | POA: Diagnosis not present

## 2023-03-27 DIAGNOSIS — Z419 Encounter for procedure for purposes other than remedying health state, unspecified: Secondary | ICD-10-CM | POA: Diagnosis not present

## 2023-04-27 DIAGNOSIS — Z419 Encounter for procedure for purposes other than remedying health state, unspecified: Secondary | ICD-10-CM | POA: Diagnosis not present

## 2023-05-27 DIAGNOSIS — Z419 Encounter for procedure for purposes other than remedying health state, unspecified: Secondary | ICD-10-CM | POA: Diagnosis not present

## 2023-06-27 DIAGNOSIS — Z419 Encounter for procedure for purposes other than remedying health state, unspecified: Secondary | ICD-10-CM | POA: Diagnosis not present

## 2023-07-27 DIAGNOSIS — Z419 Encounter for procedure for purposes other than remedying health state, unspecified: Secondary | ICD-10-CM | POA: Diagnosis not present

## 2023-08-27 DIAGNOSIS — Z419 Encounter for procedure for purposes other than remedying health state, unspecified: Secondary | ICD-10-CM | POA: Diagnosis not present

## 2023-09-27 DIAGNOSIS — Z419 Encounter for procedure for purposes other than remedying health state, unspecified: Secondary | ICD-10-CM | POA: Diagnosis not present

## 2023-10-25 DIAGNOSIS — Z419 Encounter for procedure for purposes other than remedying health state, unspecified: Secondary | ICD-10-CM | POA: Diagnosis not present

## 2023-12-06 DIAGNOSIS — Z419 Encounter for procedure for purposes other than remedying health state, unspecified: Secondary | ICD-10-CM | POA: Diagnosis not present

## 2024-01-05 DIAGNOSIS — Z419 Encounter for procedure for purposes other than remedying health state, unspecified: Secondary | ICD-10-CM | POA: Diagnosis not present

## 2024-02-05 DIAGNOSIS — Z419 Encounter for procedure for purposes other than remedying health state, unspecified: Secondary | ICD-10-CM | POA: Diagnosis not present

## 2024-03-06 DIAGNOSIS — Z419 Encounter for procedure for purposes other than remedying health state, unspecified: Secondary | ICD-10-CM | POA: Diagnosis not present

## 2024-04-06 DIAGNOSIS — Z419 Encounter for procedure for purposes other than remedying health state, unspecified: Secondary | ICD-10-CM | POA: Diagnosis not present

## 2024-05-07 DIAGNOSIS — Z419 Encounter for procedure for purposes other than remedying health state, unspecified: Secondary | ICD-10-CM | POA: Diagnosis not present

## 2024-08-06 DIAGNOSIS — Z419 Encounter for procedure for purposes other than remedying health state, unspecified: Secondary | ICD-10-CM | POA: Diagnosis not present
